# Patient Record
Sex: Male | Born: 1970 | Race: Black or African American | Hispanic: No | Marital: Married | Smoking: Never smoker
Health system: Southern US, Community
[De-identification: ages and names within clinical notes are randomized; demographics above are authoritative.]

---

## 1998-02-19 ENCOUNTER — Emergency Department (HOSPITAL_COMMUNITY): Admission: EM | Admit: 1998-02-19 | Discharge: 1998-02-19 | Payer: Self-pay | Admitting: Emergency Medicine

## 2005-07-22 ENCOUNTER — Encounter: Payer: Self-pay | Admitting: Emergency Medicine

## 2014-06-29 ENCOUNTER — Emergency Department (HOSPITAL_COMMUNITY)
Admission: EM | Admit: 2014-06-29 | Discharge: 2014-06-30 | Disposition: A | Discharge status: Home / Self Care | Payer: BLUE CROSS/BLUE SHIELD | Attending: Emergency Medicine | Admitting: Emergency Medicine

## 2014-06-29 ENCOUNTER — Emergency Department (HOSPITAL_COMMUNITY): Payer: BLUE CROSS/BLUE SHIELD

## 2014-06-29 ENCOUNTER — Encounter (HOSPITAL_COMMUNITY): Payer: Self-pay | Admitting: Emergency Medicine

## 2014-06-29 DIAGNOSIS — R1030 Lower abdominal pain, unspecified: Secondary | ICD-10-CM | POA: Diagnosis present

## 2014-06-29 DIAGNOSIS — R319 Hematuria, unspecified: Secondary | ICD-10-CM | POA: Diagnosis not present

## 2014-06-29 DIAGNOSIS — Z87442 Personal history of urinary calculi: Secondary | ICD-10-CM

## 2014-06-29 DIAGNOSIS — N281 Cyst of kidney, acquired: Secondary | ICD-10-CM | POA: Diagnosis not present

## 2014-06-29 DIAGNOSIS — R110 Nausea: Secondary | ICD-10-CM

## 2014-06-29 DIAGNOSIS — R109 Unspecified abdominal pain: Secondary | ICD-10-CM

## 2014-06-29 LAB — COMPREHENSIVE METABOLIC PANEL
ALK PHOS: 102 U/L (ref 39–117)
ALT: 23 U/L (ref 0–53)
ANION GAP: 8 (ref 5–15)
AST: 34 U/L (ref 0–37)
Albumin: 4.4 g/dL (ref 3.5–5.2)
BUN: 18 mg/dL (ref 6–23)
CALCIUM: 9.9 mg/dL (ref 8.4–10.5)
CO2: 25 mmol/L (ref 19–32)
Chloride: 108 mmol/L (ref 96–112)
Creatinine, Ser: 1.2 mg/dL (ref 0.50–1.35)
GFR calc Af Amer: 83 mL/min — ABNORMAL LOW (ref >90)
GFR, EST NON AFRICAN AMERICAN: 72 mL/min — AB (ref >90)
Glucose, Bld: 127 mg/dL — ABNORMAL HIGH (ref 70–99)
Potassium: 3.5 mmol/L (ref 3.5–5.1)
SODIUM: 141 mmol/L (ref 135–145)
TOTAL PROTEIN: 6.9 g/dL (ref 6.0–8.3)
Total Bilirubin: 1.6 mg/dL — ABNORMAL HIGH (ref 0.3–1.2)

## 2014-06-29 LAB — URINALYSIS, ROUTINE W REFLEX MICROSCOPIC
BILIRUBIN URINE: NEGATIVE
GLUCOSE, UA: 100 mg/dL — AB
KETONES UR: 15 mg/dL — AB
Leukocytes, UA: NEGATIVE
Nitrite: NEGATIVE
PH: 6.5 (ref 5.0–8.0)
Protein, ur: NEGATIVE mg/dL
SPECIFIC GRAVITY, URINE: 1.025 (ref 1.005–1.030)
Urobilinogen, UA: 1 mg/dL (ref 0.0–1.0)

## 2014-06-29 LAB — CBC WITH DIFFERENTIAL/PLATELET
Basophils Absolute: 0 10*3/uL (ref 0.0–0.1)
Basophils Relative: 0 % (ref 0–1)
EOS ABS: 0.1 10*3/uL (ref 0.0–0.7)
EOS PCT: 1 % (ref 0–5)
HCT: 40.8 % (ref 39.0–52.0)
Hemoglobin: 14.1 g/dL (ref 13.0–17.0)
LYMPHS PCT: 54 % — AB (ref 12–46)
Lymphs Abs: 5.6 10*3/uL — ABNORMAL HIGH (ref 0.7–4.0)
MCH: 29.6 pg (ref 26.0–34.0)
MCHC: 34.6 g/dL (ref 30.0–36.0)
MCV: 85.7 fL (ref 78.0–100.0)
MONOS PCT: 7 % (ref 3–12)
Monocytes Absolute: 0.7 10*3/uL (ref 0.1–1.0)
NEUTROS PCT: 38 % — AB (ref 43–77)
Neutro Abs: 3.9 10*3/uL (ref 1.7–7.7)
PLATELETS: 245 10*3/uL (ref 150–400)
RBC: 4.76 MIL/uL (ref 4.22–5.81)
RDW: 12.6 % (ref 11.5–15.5)
WBC: 10.3 10*3/uL (ref 4.0–10.5)

## 2014-06-29 LAB — URINE MICROSCOPIC-ADD ON

## 2014-06-29 LAB — LIPASE, BLOOD: Lipase: 32 U/L (ref 11–59)

## 2014-06-29 MED ORDER — ONDANSETRON HCL 4 MG/2ML IJ SOLN
4.0000 mg | Freq: Once | INTRAMUSCULAR | Status: AC
  Administered 2014-06-29: 4 mg via INTRAVENOUS
  Filled 2014-06-29: qty 2

## 2014-06-29 MED ORDER — HYDROMORPHONE HCL 1 MG/ML IJ SOLN
1.0000 mg | Freq: Once | INTRAMUSCULAR | Status: AC
  Administered 2014-06-29: 1 mg via INTRAVENOUS
  Filled 2014-06-29: qty 1

## 2014-06-29 MED ORDER — SODIUM CHLORIDE 0.9 % IV BOLUS (SEPSIS)
500.0000 mL | Freq: Once | INTRAVENOUS | Status: AC
  Administered 2014-06-29: 500 mL via INTRAVENOUS

## 2014-06-29 MED ORDER — FENTANYL CITRATE 0.05 MG/ML IJ SOLN
50.0000 ug | Freq: Once | INTRAMUSCULAR | Status: AC
  Administered 2014-06-29: 50 ug via NASAL

## 2014-06-29 MED ORDER — FENTANYL CITRATE 0.05 MG/ML IJ SOLN
INTRAMUSCULAR | Status: AC
  Filled 2014-06-29: qty 2

## 2014-06-29 MED ORDER — MORPHINE SULFATE 4 MG/ML IJ SOLN
4.0000 mg | Freq: Once | INTRAMUSCULAR | Status: AC
  Administered 2014-06-29: 4 mg via INTRAVENOUS
  Filled 2014-06-29: qty 1

## 2014-06-29 MED ORDER — KETOROLAC TROMETHAMINE 30 MG/ML IJ SOLN
15.0000 mg | Freq: Once | INTRAMUSCULAR | Status: AC
  Administered 2014-06-29: 15 mg via INTRAVENOUS
  Filled 2014-06-29: qty 1

## 2014-06-29 NOTE — ED Provider Notes (Signed)
CSN: [id number]     Arrival date & time 06/29/14  2043 History   First MD Initiated Contact with Patient 06/29/14 2125     Chief Complaint  Patient presents with  . Flank Pain    The patient said he went to the ED in [place] and they advised him he had two kidney stones.  He said he passed one on SUnday and the other one he thinks he is passing it now.     (Consider location/radiation/quality/duration/timing/severity/associated sxs/prior Treatment) HPI Comments: Trevor Cisneros is a 44 y.o. male with a PMHx of nephrolithiasis, who presents to the ED accompanied by his wife, with complaints of left flank pain that began around 8:30 PM. He reports that earlier this week he had a CT done in [place] which found a 5 mm kidney stone that was "stuck", and although he has been straining his urine he has not had any stone passed. He has appointment in [place] with the urologist on Monday. He reports the pain occurred again at 8:30 PM stating it is a 10/10 intermittent stabbing which radiates to his lateral abdomen, with no known aggravating factors, and unrelieved with Percocet. Associated symptoms include nausea. He denies any fevers, chills, chest pain, shortness breath, vomiting, diarrhea, constant, melena, hematochezia, dysuria, hematuria, urinary frequency, malodorous urine, penile discharge, testicular pain or swelling, headache, vision changes, numbness, tingling, weakness, recent travel, antibiotic use, or surgeries. He is otherwise healthy and takes no medications.  Patient is a 44 y.o. male presenting with flank pain. The history is provided by the patient and the spouse. No language interpreter was used.  Flank Pain This is a recurrent problem. The current episode started today. The problem occurs intermittently. The problem has been waxing and waning. Associated symptoms include abdominal pain (L lateral, radiating from L flank) and nausea. Pertinent negatives include no arthralgias, chest  pain, chills, fever, headaches, myalgias, numbness, rash, urinary symptoms, visual change, vomiting or weakness. Nothing aggravates the symptoms. He has tried oral narcotics for the symptoms. The treatment provided no relief.    History reviewed. No pertinent past medical history. History reviewed. No pertinent past surgical history. History reviewed. No pertinent family history. History  Substance Use Topics  . Smoking status: Never Smoker   . Smokeless tobacco: Never Used  . Alcohol Use: No    Review of Systems  Constitutional: Negative for fever and chills.  Eyes: Negative for visual disturbance.  Respiratory: Negative for shortness of breath.   Cardiovascular: Negative for chest pain.  Gastrointestinal: Positive for nausea and abdominal pain (L lateral, radiating from L flank). Negative for vomiting, diarrhea, constipation and blood in stool.  Genitourinary: Positive for flank pain (L sided). Negative for dysuria, frequency, hematuria, discharge, penile swelling, scrotal swelling and testicular pain.  Musculoskeletal: Negative for myalgias and arthralgias.  Skin: Negative for rash.  Allergic/Immunologic: Negative for immunocompromised state.  Neurological: Negative for weakness, numbness and headaches.  Psychiatric/Behavioral: Negative for confusion.   10 Systems reviewed and are negative for acute change except as noted in the HPI.    Allergies  Review of patient's allergies indicates not on file.  Home Medications   Prior to Admission medications   Not on File   BP 173/97 mmHg  Pulse 66  Temp(Src) 97.4 F (36.3 C) (Oral)  Resp 20  SpO2 100% Physical Exam  Constitutional: He is oriented to person, place, and time. He appears well-developed and well-nourished.  Non-toxic appearance. He appears distressed (writhing in pain, very uncomfortable).  Afebrile, nontoxic, writhing in pain, very uncomfortable, mildly hypertensive in the 170s/90s likely from pain  HENT:    Head: Normocephalic and atraumatic.  Mouth/Throat: Oropharynx is clear and moist and mucous membranes are normal.  Eyes: Conjunctivae and EOM are normal. Right eye exhibits no discharge. Left eye exhibits no discharge.  Neck: Normal range of motion. Neck supple.  Cardiovascular: Normal rate, regular rhythm, normal heart sounds and intact distal pulses.  Exam reveals no gallop and no friction rub.   No murmur heard. Pulmonary/Chest: Effort normal and breath sounds normal. No respiratory distress. He has no decreased breath sounds. He has no wheezes. He has no rhonchi. He has no rales.  Abdominal: Soft. Normal appearance and bowel sounds are normal. He exhibits no distension. There is tenderness in the suprapubic area, left upper quadrant and left lower quadrant. There is CVA tenderness (mild L sided). There is no rigidity, no rebound, no guarding, no tenderness at McBurney's point and negative Murphy's sign.    Soft, nondistended, +BS throughout, with L lateral abdomen and LLQ/suprapubic TTP, no r/g/r, neg murphy's, neg mcburney's, very mild L sided CVA TTP   Musculoskeletal: Normal range of motion.  MAE x4 Strength and sensation grossly intact Distal pulses intact  Neurological: He is alert and oriented to person, place, and time. He has normal strength. No sensory deficit.  Skin: Skin is warm, dry and intact. No rash noted.  Psychiatric: He has a normal mood and affect.  Nursing note and vitals reviewed.   ED Course  Procedures (including critical care time) Labs Review Labs Reviewed  CBC WITH DIFFERENTIAL/PLATELET - Abnormal; Notable for the following:    Neutrophils Relative % 38 (*)    Lymphocytes Relative 54 (*)    Lymphs Abs 5.6 (*)    All other components within normal limits  COMPREHENSIVE METABOLIC PANEL - Abnormal; Notable for the following:    Glucose, Bld 127 (*)    Total Bilirubin 1.6 (*)    GFR calc non Af Amer 72 (*)    GFR calc Af Amer 83 (*)    All other  components within normal limits  URINE CULTURE  LIPASE, BLOOD  URINALYSIS, ROUTINE W REFLEX MICROSCOPIC    Imaging Review [facility]  06/29/2014   CLINICAL DATA:  Left flank pain.  EXAM: RENAL/URINARY TRACT ULTRASOUND COMPLETE  COMPARISON:  None.  FINDINGS: Right Kidney:  Length: 11.7 cm, within normal limits. A 3.4 x 3.0 x 3.4 cm exophytic echogenic lesion is noted at the upper pole of the right kidney. No stones are evident. There is no hydronephrosis.  Left Kidney:  Length: 12.2 cm, within normal limits. Echogenicity within normal limits. No mass or hydronephrosis visualized.  Bladder:  Appears normal for degree of bladder distention.  IMPRESSION: 1. 3.4 cm echogenic exophytic lesion at the upper pole of the right kidney. While this could represent a hemorrhagic cyst, a solid neoplasm is also considered. Recommend MRI of the kidneys without and with contrast for further evaluation. Non-emergent MRI should be deferred until patient has been discharged for the acute illness, and can optimally cooperate with positioning and breath-holding instructions. 2. No evidence for nephrolithiasis or hydronephrosis.   Electronically Signed   By: Marin Robertshristopher  Mattern M.D.   On: 06/29/2014 23:12     EKG Interpretation None      MDM   Final diagnoses:  Left flank pain  Nausea  History of nephrolithiasis  Cyst of right kidney  Hematuria    44 y.o. male here  with L flank pain, known nephrolithiasis, per pt's wife he had a CT in [place] that showed a 5mm obstructed stone, has not passed as they have been straining his urine and have not found it yet. Pain increased today abruptly. On exam, mild L sided CVA tenderness, tenderness wrapping around to L lateral abdomen and suprapubic area. Nonperitoneal. Pt appears uncomfortable and his BP is 170s/90s, asymptomatic for HTN and this seems to be pain related as pt has no hx of HTN. Will give gentle hydration, morphine, zofran, and get labs and urinalysis, as well  as renal u/s to eval for obstruction/nephrolithiasis location/hydronephrosis.   11:42 PM Pain returning, will give toradol and dilaudid. CBC w/diff WNL. CMP showing mildly elevated bilirubin at 1.6 and preserved kidney function. Lipase WNL. U/A pending. U/S kidneys showing no L sided hydronephrosis, and R sided kidney cyst vs mass, which pt was aware of and will be following up with. Will await U/A and to see if pain is better controlled.  12:20 AM U/A without signs of infection, has hematuria but otherwise WNL. Pain drastically improved, nausea controlled. Will give small supply of refill of percocet, and add low dose naprosyn to his regimen. Will have him use home zofran, and continue straining urine. Will have him f/up with his urologist on Monday for ongoing management. I explained the diagnosis and have given explicit precautions to return to the ER including for any other new or worsening symptoms. The patient understands and accepts the medical plan as it's been dictated and I have answered their questions. Discharge instructions concerning home care and prescriptions have been given. The patient is STABLE and is discharged to home in good condition.  BP 177/99 mmHg  Pulse 56  Temp(Src) 97.4 F (36.3 C) (Oral)  Resp 19  SpO2 100%  Meds ordered this encounter  Medications  . fentaNYL (SUBLIMAZE) injection 50 mcg    Sig:   . fentaNYL (SUBLIMAZE) 0.05 MG/ML injection    Sig:     Hairston, Marisela  : cabinet override  . sodium chloride 0.9 % bolus 500 mL    Sig:   . ondansetron (ZOFRAN) injection 4 mg    Sig:   . morphine 4 MG/ML injection 4 mg    Sig:   . ketorolac (TORADOL) 30 MG/ML injection 15 mg    Sig:   . HYDROmorphone (DILAUDID) injection 1 mg    Sig:   . oxyCODONE-acetaminophen (PERCOCET) 7.5-325 MG per tablet    Sig: Take 1 tablet by mouth every 4 (four) hours as needed for pain.  . naproxen (NAPROSYN) 500 MG tablet    Sig: Take 0.5 tablets (250 mg total) by mouth 2  (two) times daily as needed for mild pain or moderate pain (TAKE WITH MEALS.).    Dispense:  10 tablet    Refill:  0    Order Specific Question:  Supervising Provider    Answer:  Hyacinth Meeker, BRIAN [[id number]]  . oxyCODONE-acetaminophen (PERCOCET) 5-325 MG per tablet    Sig: Take 1-2 tablets by mouth every 6 (six) hours as needed for severe pain.    Dispense:  6 tablet    Refill:  0    Order Specific Question:  Supervising Provider    Answer:  Eber Hong [[id number]]     Trevor Rauen Camprubi-Soms, PA-C 06/30/14 0021  Blane Ohara, MD 06/30/14 [phone]

## 2014-06-29 NOTE — ED Notes (Signed)
After giving the patient the fentanyl he turned pale, said he did not feel right.  He was weak and on the verge on passing out.  He never passed out but he was so flaccid he could no stan on his own.

## 2014-06-29 NOTE — ED Notes (Addendum)
The patient said he went to the ED in [place] they did an CT and they advised him he had two kidney stones.  He said he passed one on SUnday and the other one he thinks he is passing it now.  He rates his pain 10/10.

## 2014-06-30 MED ORDER — OXYCODONE-ACETAMINOPHEN 5-325 MG PO TABS: 1.0000 | ORAL_TABLET | Freq: Four times a day (QID) | ORAL | Status: AC | PRN

## 2014-06-30 MED ORDER — NAPROXEN 500 MG PO TABS: 250.0000 mg | ORAL_TABLET | Freq: Two times a day (BID) | ORAL | Status: AC | PRN

## 2014-06-30 NOTE — Discharge Instructions (Signed)
Take naprosyn as directed as needed for inflammation and pain using your home percocet for breakthrough pain. Do not drive or operate machinery with pain medication use. May need over-the-counter stool softener with this pain medication use. Followup with urologist on Monday for recheck of ongoing pain and for evaluation of your right kidney cyst, however for intractable or uncontrollable pain at home then return to the emergency department. Use your home Zofran as needed for nausea. Continue straining your urine.    Flank Pain Flank pain is pain in your side. The flank is the area of your side between your upper belly (abdomen) and your back. Pain in this area can be caused by many different things. HOME CARE Home care and treatment will depend on the cause of your pain.  Rest as told by your doctor.  Drink enough fluids to keep your pee (urine) clear or pale yellow.  Only take medicine as told by your doctor.  Tell your doctor about any changes in your pain.  Follow up with your doctor. GET HELP RIGHT AWAY IF:   Your pain does not get better with medicine.   You have new symptoms or your symptoms get worse.  Your pain gets worse.   You have belly (abdominal) pain.   You are short of breath.   You always feel sick to your stomach (nauseous).   You keep throwing up (vomiting).   You have puffiness (swelling) in your belly.   You feel light-headed or you pass out (faint).   You have blood in your pee.  You have a fever or lasting symptoms for more than 2-3 days.  You have a fever and your symptoms suddenly get worse. MAKE SURE YOU:   Understand these instructions.  Will watch your condition.  Will get help right away if you are not doing well or get worse. Document Released: 12/17/2007 Document Revised: 07/24/2013 Document Reviewed: 10/22/2011 [facility] Patient Information 2015 [employer] [place]. This information is not intended to replace advice given to you  by your health care provider. Make sure you discuss any questions you have with your health care provider.

## 2014-06-30 NOTE — ED Notes (Signed)
Pt verbalized understanding of d/c instructions and has no further questions.  

## 2014-07-01 LAB — URINE CULTURE
CULTURE: NO GROWTH
Colony Count: NO GROWTH
SPECIAL REQUESTS: NORMAL

## 2016-06-25 IMAGING — US US RENAL
1 series · 14 of 21 positions shown · non-contrast
Comparison: None.

CLINICAL DATA: Left flank pain.

EXAM:
RENAL/URINARY TRACT ULTRASOUND COMPLETE

[Series 1: us renal · 0.23mm/px · 14 of 21 slices shown]
[im 1/21]
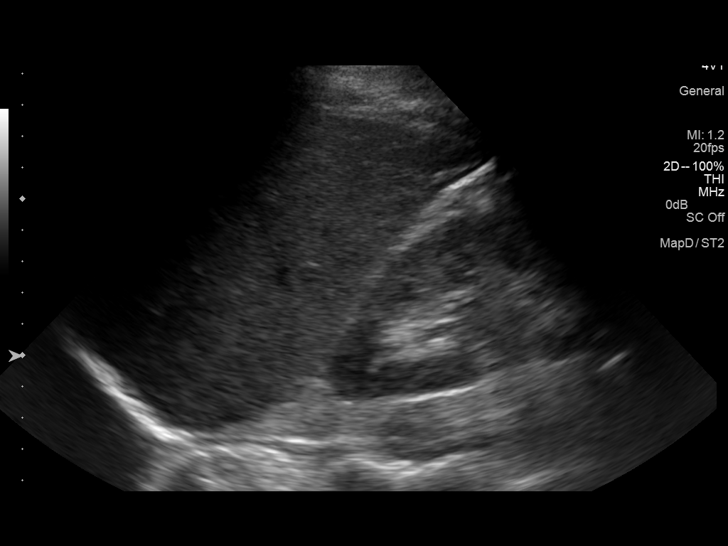
[im 3/21]
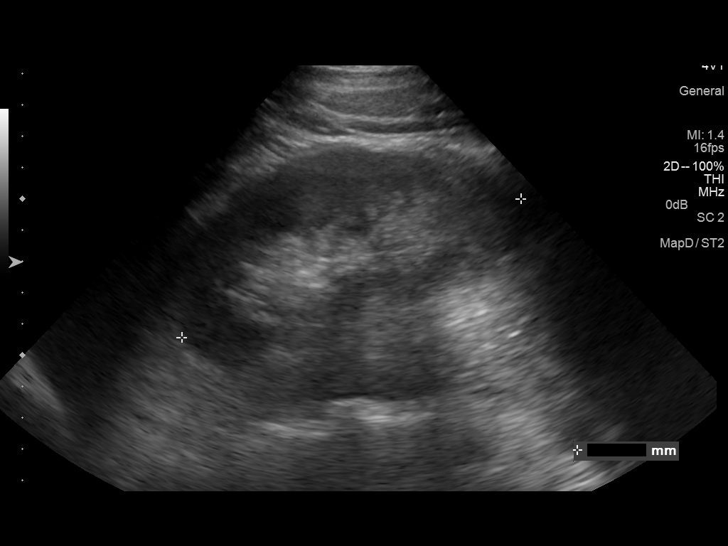
[im 4/21]
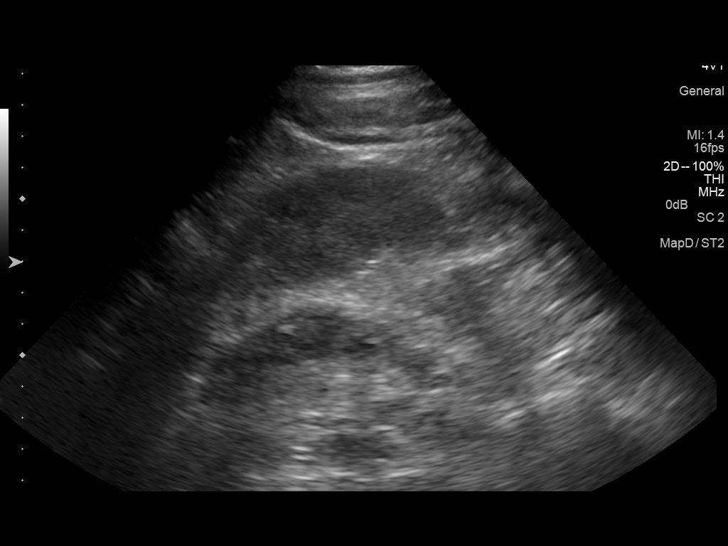
[im 6/21]
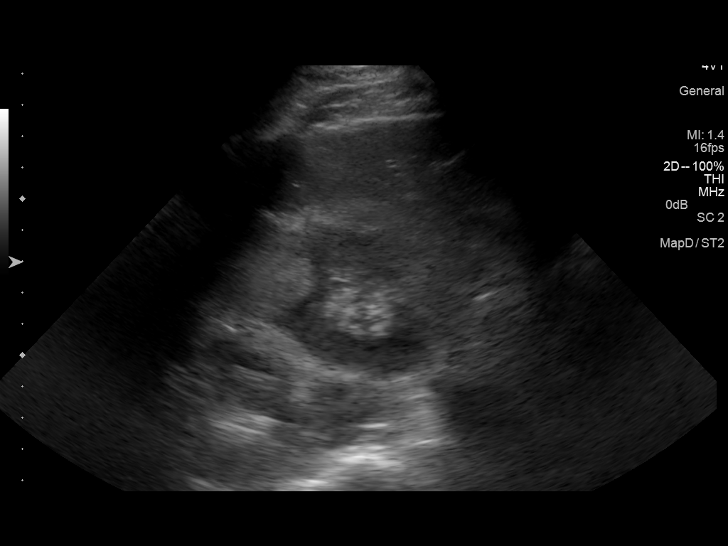
[im 7/21]
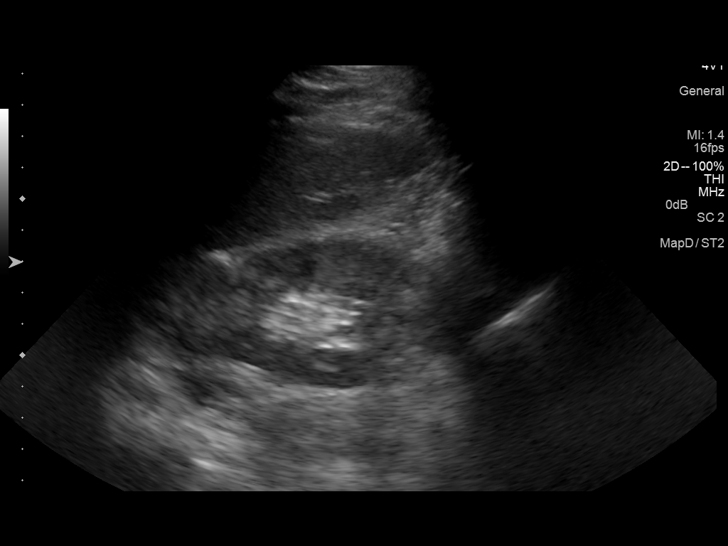
[im 9/21]
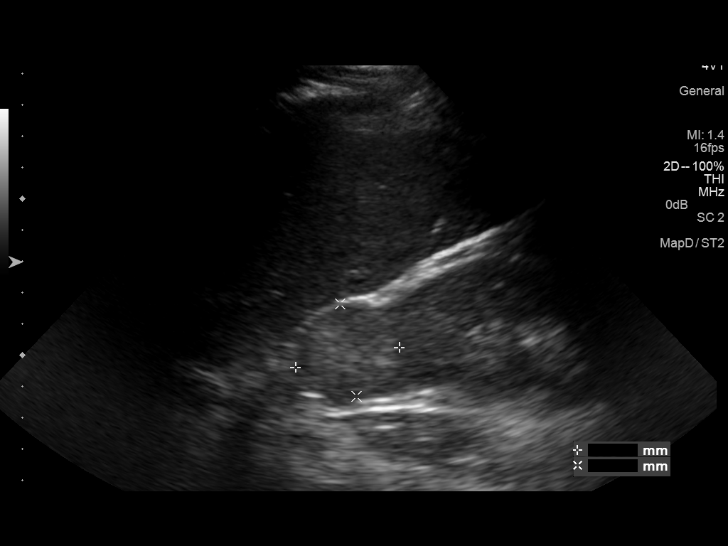
[im 10/21]
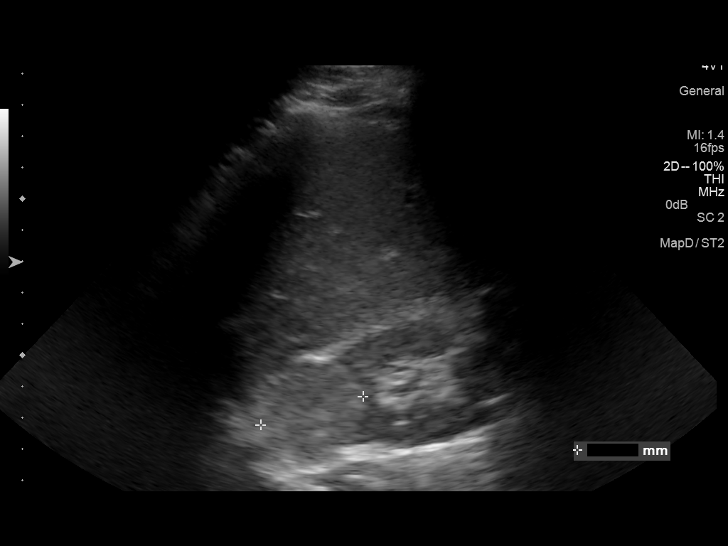
[im 12/21]
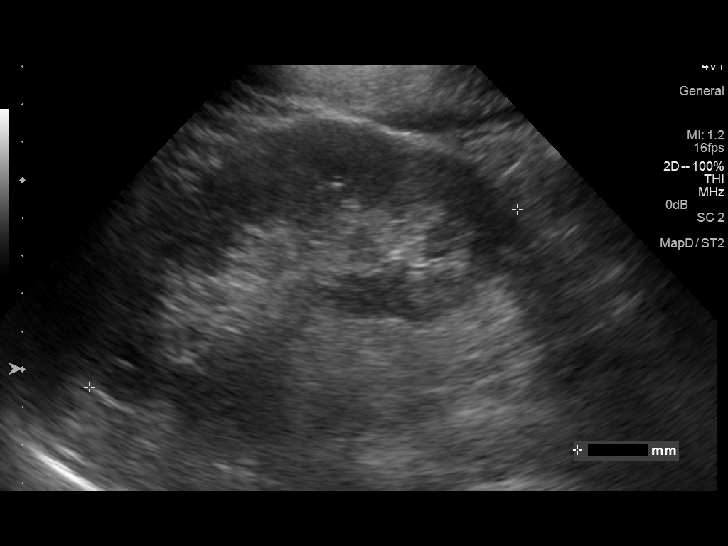
[im 13/21]
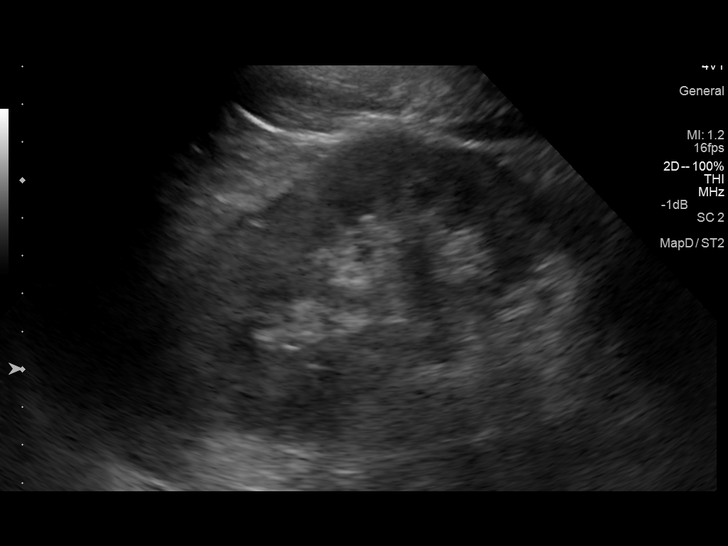
[im 15/21]
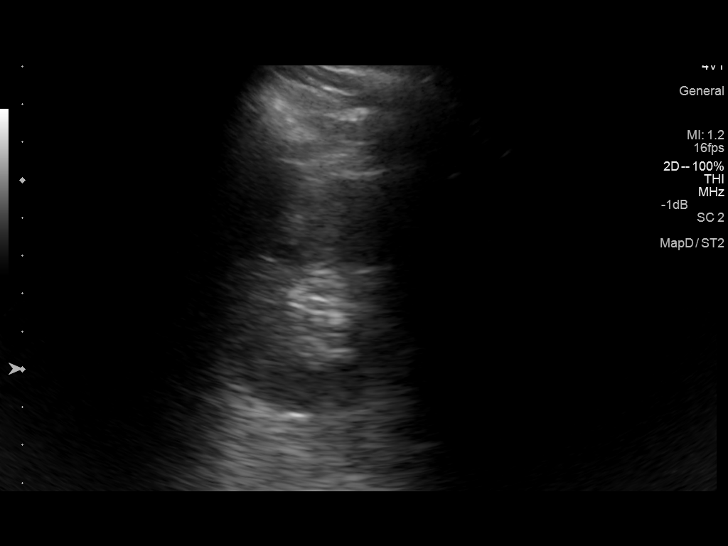
[im 16/21]
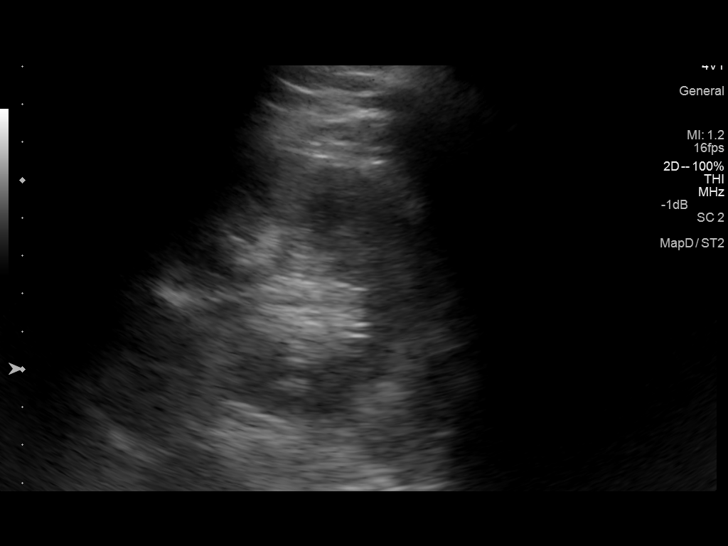
[im 18/21]
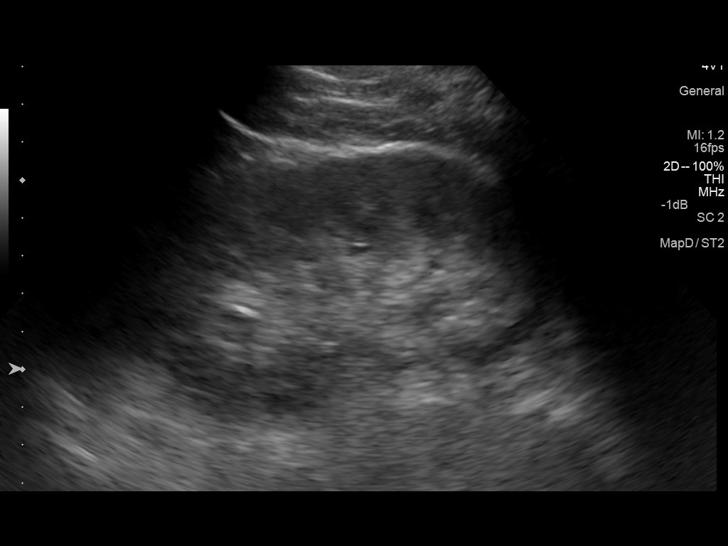
[im 19/21]
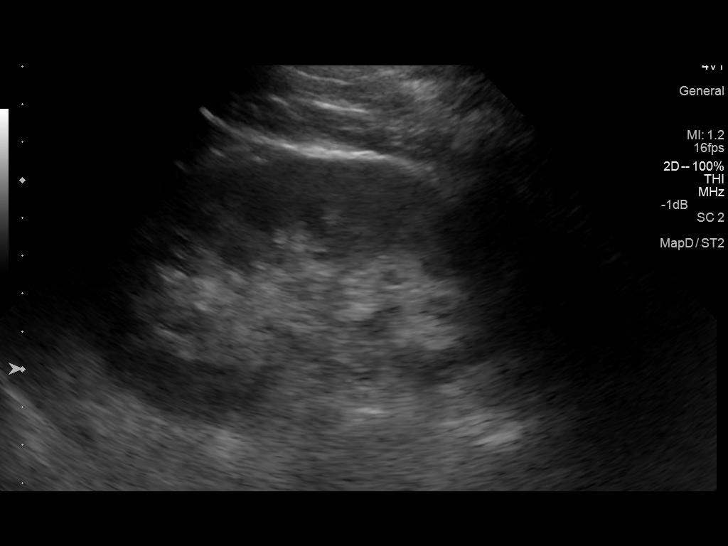
[im 21/21]
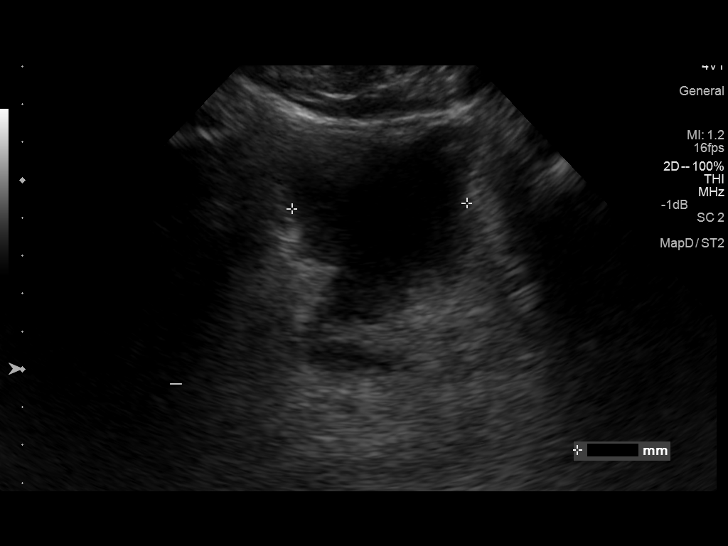

[14 of 21 positions shown; findings below may reference images not displayed]

FINDINGS: Right Kidney:

Length: 11.7 cm, within normal limits. A 3.4 x 3.0 x 3.4 cm
exophytic echogenic lesion is noted at the upper pole of the right
kidney. No stones are evident. There is no hydronephrosis.

Left Kidney:

Length: 12.2 cm, within normal limits. Echogenicity within normal
limits. No mass or hydronephrosis visualized.

Bladder:

Appears normal for degree of bladder distention.
IMPRESSION: 1. 3.4 cm echogenic exophytic lesion at the upper pole of the right
kidney. While this could represent a hemorrhagic cyst, a solid
neoplasm is also considered. Recommend MRI of the kidneys without
and with contrast for further evaluation. Non-emergent MRI should be
deferred until patient has been discharged for the acute illness,
and can optimally cooperate with positioning and breath-holding
instructions.
2. No evidence for nephrolithiasis or hydronephrosis.
